# Patient Record
Sex: Male | Born: 1965
Health system: Southern US, Community
[De-identification: ages and names within clinical notes are randomized; demographics above are authoritative.]

## PROBLEM LIST (undated history)

## (undated) DIAGNOSIS — I10 Essential (primary) hypertension: Secondary | ICD-10-CM

## (undated) HISTORY — DX: Essential (primary) hypertension: I10

## (undated) HISTORY — PX: OTHER SURGICAL HISTORY: SHX169

---

## 2009-11-09 ENCOUNTER — Ambulatory Visit: Payer: Self-pay | Admitting: Surgery

## 2009-11-09 ENCOUNTER — Encounter (INDEPENDENT_AMBULATORY_CARE_PROVIDER_SITE_OTHER): Payer: Self-pay | Admitting: Orthopedic Surgery

## 2009-11-09 ENCOUNTER — Ambulatory Visit: Admission: RE | Admit: 2009-11-09 | Discharge: 2009-11-09 | Payer: Self-pay | Admitting: Orthopedic Surgery

## 2011-09-30 ENCOUNTER — Other Ambulatory Visit: Payer: Self-pay | Admitting: Physician Assistant

## 2012-01-30 ENCOUNTER — Other Ambulatory Visit: Payer: Self-pay | Admitting: Physician Assistant

## 2012-04-08 ENCOUNTER — Ambulatory Visit: Payer: 59

## 2012-04-08 ENCOUNTER — Ambulatory Visit (INDEPENDENT_AMBULATORY_CARE_PROVIDER_SITE_OTHER): Payer: 59 | Admitting: Family Medicine

## 2012-04-08 VITALS — BP 164/90 | HR 72 | Temp 99.0°F | Resp 16 | Ht 69.25 in | Wt 164.2 lb

## 2012-04-08 DIAGNOSIS — I10 Essential (primary) hypertension: Secondary | ICD-10-CM

## 2012-04-08 DIAGNOSIS — R200 Anesthesia of skin: Secondary | ICD-10-CM

## 2012-04-08 DIAGNOSIS — R079 Chest pain, unspecified: Secondary | ICD-10-CM

## 2012-04-08 DIAGNOSIS — R209 Unspecified disturbances of skin sensation: Secondary | ICD-10-CM

## 2012-04-08 LAB — COMPREHENSIVE METABOLIC PANEL WITH GFR
AST: 11 U/L (ref 0–37)
Albumin: 4.4 g/dL (ref 3.5–5.2)
Alkaline Phosphatase: 63 U/L (ref 39–117)
BUN: 7 mg/dL (ref 6–23)
Creat: 1.02 mg/dL (ref 0.50–1.35)
Potassium: 4.5 meq/L (ref 3.5–5.3)

## 2012-04-08 LAB — POCT CBC
Granulocyte percent: 67 % (ref 37–80)
HCT, POC: 45.2 % (ref 43.5–53.7)
Hemoglobin: 14.3 g/dL (ref 14.1–18.1)
Lymph, poc: 2 (ref 0.6–3.4)
MCH, POC: 30.2 pg (ref 27–31.2)
MCHC: 31.6 g/dL — AB (ref 31.8–35.4)
MCV: 95.5 fL (ref 80–97)
MID (cbc): 0.6 (ref 0–0.9)
MPV: 9.3 fL (ref 0–99.8)
POC Granulocyte: 5.3 (ref 2–6.9)
POC LYMPH PERCENT: 25.7 % (ref 10–50)
POC MID %: 7.3 % (ref 0–12)
Platelet Count, POC: 368 10*3/uL (ref 142–424)
RBC: 4.73 M/uL (ref 4.69–6.13)
RDW, POC: 14.2 %
WBC: 7.9 10*3/uL (ref 4.6–10.2)

## 2012-04-08 LAB — D-DIMER, QUANTITATIVE: D-Dimer, Quant: 0.27 ug/mL-FEU (ref 0.00–0.48)

## 2012-04-08 LAB — TROPONIN I: Troponin I: <0.01 ng/mL (ref <0.06)

## 2012-04-08 LAB — COMPREHENSIVE METABOLIC PANEL
ALT: 9 U/L (ref 0–53)
CO2: 24 mEq/L (ref 19–32)
Calcium: 10.1 mg/dL (ref 8.4–10.5)
Chloride: 104 mEq/L (ref 96–112)
Glucose, Bld: 91 mg/dL (ref 70–99)
Sodium: 142 mEq/L (ref 135–145)
Total Bilirubin: 0.4 mg/dL (ref 0.3–1.2)
Total Protein: 7.6 g/dL (ref 6.0–8.3)

## 2012-04-08 LAB — LDL CHOLESTEROL, DIRECT: Direct LDL: 80 mg/dL

## 2012-04-08 MED ORDER — LISINOPRIL 20 MG PO TABS: 20.0000 mg | ORAL_TABLET | Freq: Every day | ORAL | Status: DC

## 2012-04-08 NOTE — Progress Notes (Signed)
[facility]:  Office Visit  Chief Complaint:  Chief Complaint  Patient presents with  . Arm Problem    x today    eft shoulder radiating to fingers  numb,ingling  . Back Pain    x today   mid  left back pain  . Breathing Problem    intermittent  mild pressure with breathing    HPI: Michael Dickson is a 46 y.o. male who complains of left arm numbness starting from shoulder radiating down to left hand, fingers, associated with back pain radiating to chest with deep breathing. CP described as sharp pain with deep breathing  1/10 on pain scale. No prior history of CP. Denies high cholesterol, denies diabetes. Denies neck or back injury. Prior h/o right sided broken ribs. Has been taking in a lot of caffeine. Service technician for heating and air. Patient's been under a lot of stress with daughter recently.  Has had  A lot of stress in last month 160s/100s but normally runs 140s/80s H/o smoker 1 ppd x 10 years, quit 14 years ago Father with h/o MI at age 20  Past Medical History  Diagnosis Date  . Hypertension    Past Surgical History  Procedure Date  . Right knee arthroscopy    History   Social History  . Marital Status: Married    Spouse Name: N/A    Number of Children: N/A  . Years of Education: N/A   Social History Main Topics  . Smoking status: Former Games developer  . Smokeless tobacco: None  . Alcohol Use: None  . Drug Use: None  . Sexually Active: None   Other Topics Concern  . None   Social History Narrative  . None   Family History  Problem Relation Age of Onset  . Cancer Mother   . Heart disease Father    Allergies  Allergen Reactions  . Penicillins Other (See Comments)    Childhood    Prior to Admission medications   Medication Sig Start Date End Date Taking? Authorizing Provider  lisinopril (PRINIVIL,ZESTRIL) 20 MG tablet TAKE 1 TABLET BY MOUTH EVERY DAY 01/30/12  Yes Pattricia Boss, PA-C     ROS: The patient denies fevers,  chills, night sweats, unintentional weight loss,palpitations, wheezing, dyspnea on exertion, nausea, vomiting, abdominal pain, dysuria, hematuria, melena,  weakness,  + HA off and on.   All other systems have been reviewed and were otherwise negative with the exception of those mentioned in the HPI and as above.    PHYSICAL EXAM: Filed Vitals:   04/08/12 1334  BP: 164/90  Pulse: 72  Temp: 99 F (37.2 C)  Resp: 16   Filed Vitals:   04/08/12 1334  Height: 5' 9.25" (1.759 m)  Weight: 164 lb 3.2 oz (74.481 kg)   Body mass index is 24.07 kg/(m^2).  General: Alert, no acute distress HEENT:  Normocephalic, atraumatic, oropharynx patent. EOMI, PERRLA, fundoscopic exam nl Cardiovascular:  Regular rate and rhythm, no rubs murmurs or gallops.  No Carotid bruits, radial pulse intact. No pedal edema. No JVD Respiratory: Clear to auscultation bilaterally.  No wheezes, rales, or rhonchi.  No cyanosis, no use of accessory musculature GI: No organomegaly, abdomen is soft and non-tender, positive bowel sounds.  No masses. Skin: No rashes. Neurologic: Facial musculature symmetric. Psychiatric: Patient is appropriate throughout our interaction. Lymphatic: No cervical lymphadenopathy Musculoskeletal: Gait intact.   LABS: Results for orders placed in visit on 04/08/12  POCT CBC  Component Value Range   WBC 7.9  4.6 - 10.2 K/uL   Lymph, poc 2.0  0.6 - 3.4   POC LYMPH PERCENT 25.7  10 - 50 %L   MID (cbc) 0.6  0 - 0.9   POC MID % 7.3  0 - 12 %M   POC Granulocyte 5.3  2 - 6.9   Granulocyte percent 67.0  37 - 80 %G   RBC 4.73  4.69 - 6.13 M/uL   Hemoglobin 14.3  14.1 - 18.1 g/dL   HCT, POC 91.4  78.2 - 53.7 %   MCV 95.5  80 - 97 fL   MCH, POC 30.2  27 - 31.2 pg   MCHC 31.6 (*) 31.8 - 35.4 g/dL   RDW, POC 95.6     Platelet Count, POC 368  142 - 424 K/uL   MPV 9.3  0 - 99.8 fL     EKG/XRAY:   Primary read interpreted by Dr. Conley Rolls at [facility]. CXR no acute cardiopulmonary process EKG 90 bpm,  NSR, no ST elevation/depression   ASSESSMENT/PLAN: Encounter Diagnoses  Name Primary?  . Left arm numbness Yes  . Chest pain    Primary complaint is left arm numbness Chest pain is atypical, associated with pleurtitic CP with deep breaths Risk factor is HTN, no family history of premature MIs, EKG normal, CXR normal Will get D/Dimer, Troponin, CMP Directed to go to ER for worsening sxs  F/u if no improvement in numbness, tingling. If all labs nl then recommend NSAID. Will call patient with further management recommendations after get test results.   Monitor HTN-Refilled HTN meds. Lisinopril 20 mg daily if continue to be higher than 140/90 then start BID.     Elyana Grabski PHUONG, DO 04/08/2012 3:02 PM

## 2012-04-10 ENCOUNTER — Telehealth: Payer: Self-pay | Admitting: Family Medicine

## 2012-04-10 NOTE — Telephone Encounter (Signed)
Lm that all labs normal.

## 2012-11-08 ENCOUNTER — Other Ambulatory Visit: Payer: Self-pay | Admitting: Family Medicine

## 2013-06-14 IMAGING — CR DG CHEST 2V
2 series · 2 of 2 positions shown · non-contrast
Comparison: None.

CLINICAL DATA: Left arm numbness.  Chest pain.

CHEST - 2 VIEW

[PA]
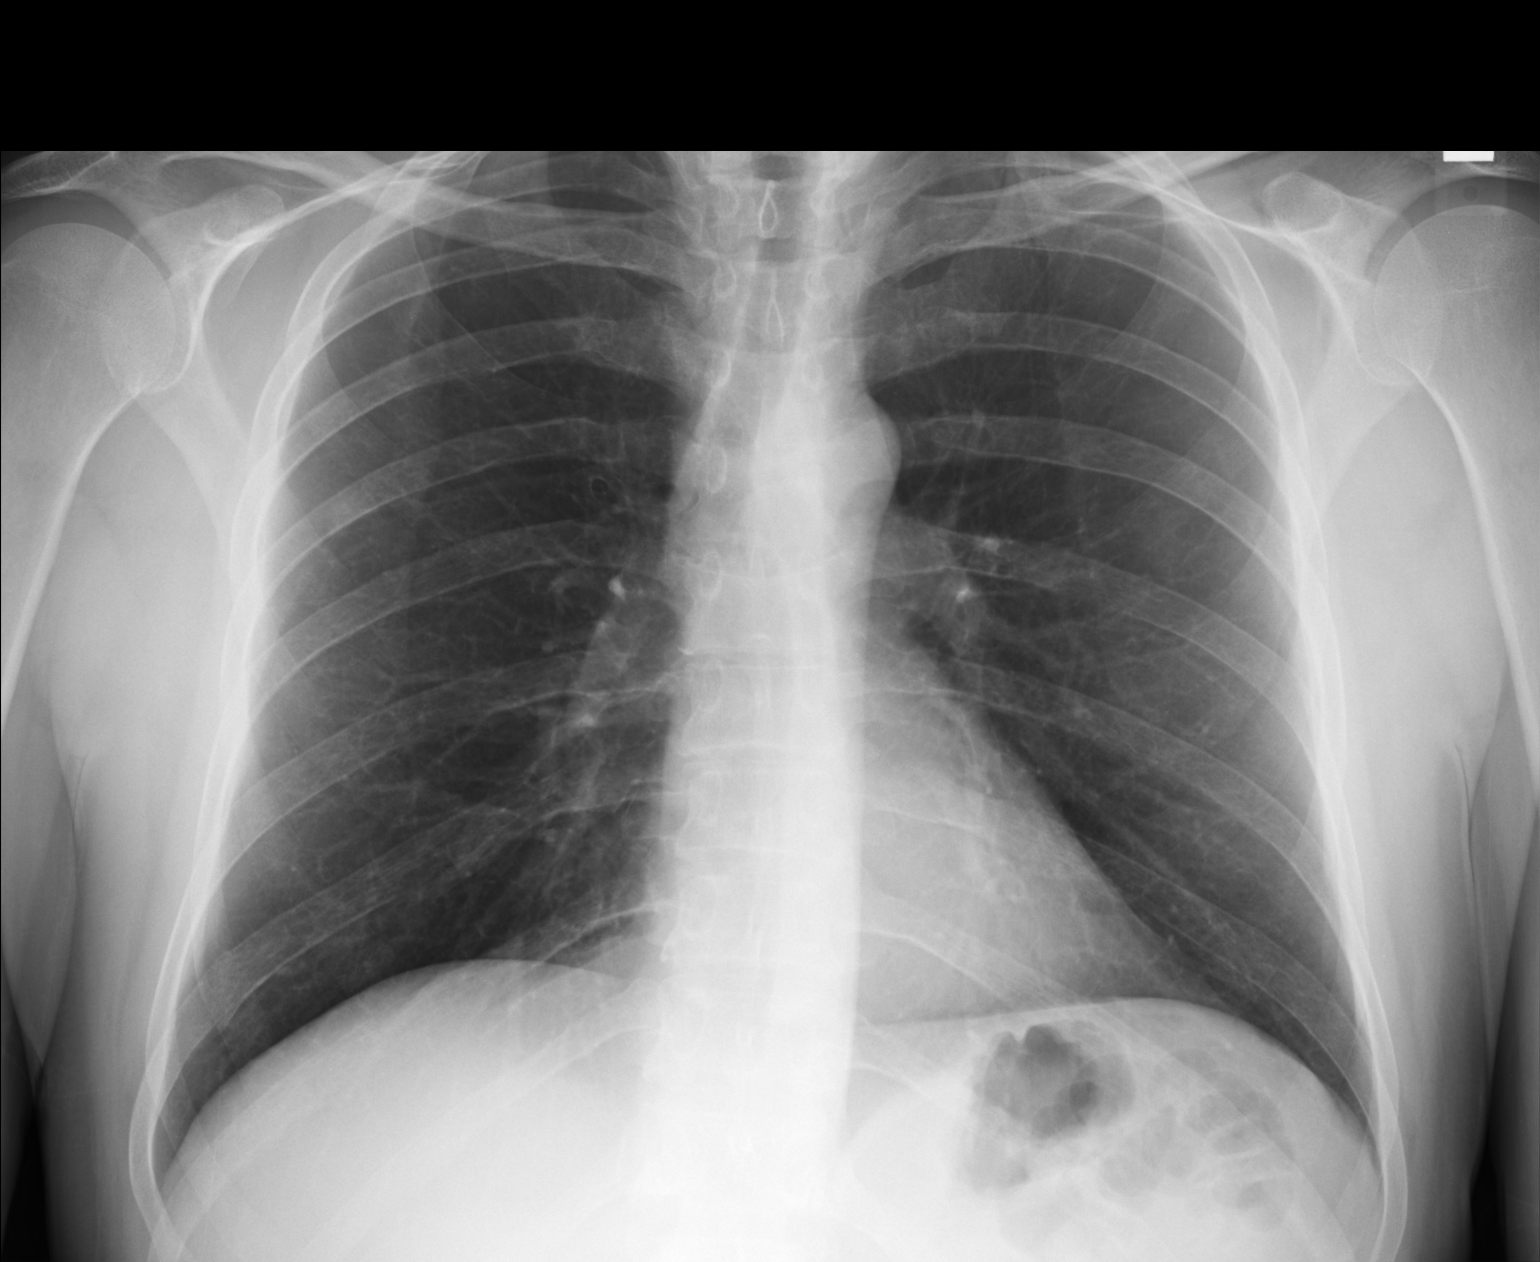

[lateral]
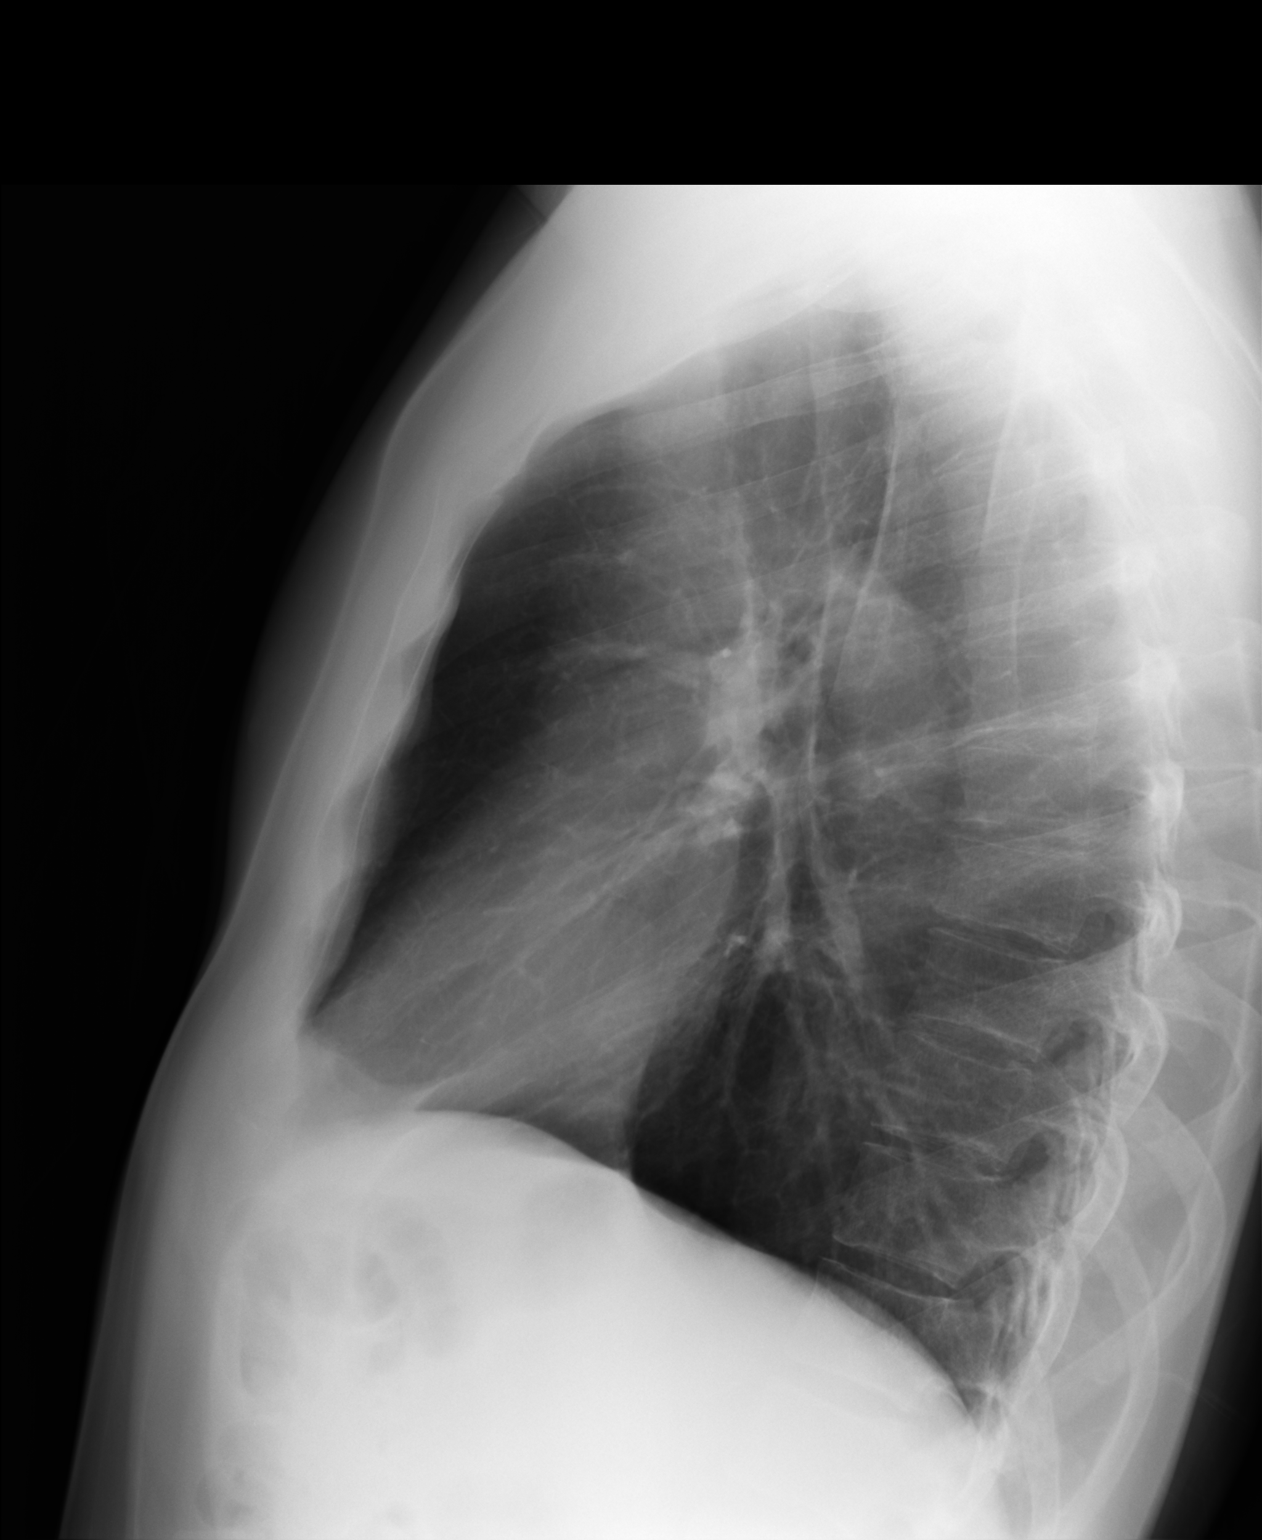

[2 of 2 positions shown; findings below may reference images not displayed]

FINDINGS: Normal heart size.  Lungs hyperaerated and clear.  No
pneumothorax.  Right posterolateral rib deformity has a chronic
appearance.
IMPRESSION: No active cardiopulmonary disease.  Changes related to COPD are
noted.

Clinically significant discrepancy from primary report, if
provided: None

## 2016-02-28 ENCOUNTER — Ambulatory Visit (INDEPENDENT_AMBULATORY_CARE_PROVIDER_SITE_OTHER): Payer: 59

## 2016-02-28 ENCOUNTER — Ambulatory Visit (INDEPENDENT_AMBULATORY_CARE_PROVIDER_SITE_OTHER): Payer: 59 | Admitting: Family Medicine

## 2016-02-28 VITALS — BP 150/98 | HR 79 | Temp 98.9°F | Resp 18 | Ht 69.25 in | Wt 174.0 lb

## 2016-02-28 DIAGNOSIS — IMO0001 Reserved for inherently not codable concepts without codable children: Secondary | ICD-10-CM

## 2016-02-28 DIAGNOSIS — Z8249 Family history of ischemic heart disease and other diseases of the circulatory system: Secondary | ICD-10-CM

## 2016-02-28 DIAGNOSIS — R0789 Other chest pain: Secondary | ICD-10-CM

## 2016-02-28 DIAGNOSIS — J988 Other specified respiratory disorders: Secondary | ICD-10-CM | POA: Diagnosis not present

## 2016-02-28 DIAGNOSIS — J22 Unspecified acute lower respiratory infection: Secondary | ICD-10-CM

## 2016-02-28 DIAGNOSIS — R030 Elevated blood-pressure reading, without diagnosis of hypertension: Secondary | ICD-10-CM

## 2016-02-28 DIAGNOSIS — R059 Cough, unspecified: Secondary | ICD-10-CM

## 2016-02-28 DIAGNOSIS — R05 Cough: Secondary | ICD-10-CM

## 2016-02-28 LAB — COMPREHENSIVE METABOLIC PANEL
ALK PHOS: 80 U/L (ref 40–115)
ALT: 11 U/L (ref 9–46)
AST: 15 U/L (ref 10–40)
Albumin: 4.1 g/dL (ref 3.6–5.1)
BUN: 10 mg/dL (ref 7–25)
CALCIUM: 9.5 mg/dL (ref 8.6–10.3)
CHLORIDE: 103 mmol/L (ref 98–110)
CO2: 22 mmol/L (ref 20–31)
Creat: 0.96 mg/dL (ref 0.60–1.35)
GLUCOSE: 82 mg/dL (ref 65–99)
POTASSIUM: 3.9 mmol/L (ref 3.5–5.3)
Sodium: 139 mmol/L (ref 135–146)
Total Bilirubin: 0.6 mg/dL (ref 0.2–1.2)
Total Protein: 7.3 g/dL (ref 6.1–8.1)

## 2016-02-28 LAB — POCT CBC
Granulocyte percent: 73 %G (ref 37–80)
HCT, POC: 40.1 % — AB (ref 43.5–53.7)
Hemoglobin: 14.4 g/dL (ref 14.1–18.1)
Lymph, poc: 2 (ref 0.6–3.4)
MCH: 32 pg — AB (ref 27–31.2)
MCHC: 35.8 g/dL — AB (ref 31.8–35.4)
MCV: 89.2 fL (ref 80–97)
MID (CBC): 0.9 (ref 0–0.9)
MPV: 7.1 fL (ref 0–99.8)
POC Granulocyte: 7.9 — AB (ref 2–6.9)
POC LYMPH PERCENT: 18.8 %L (ref 10–50)
POC MID %: 8.2 % (ref 0–12)
Platelet Count, POC: 318 10*3/uL (ref 142–424)
RBC: 4.49 M/uL — AB (ref 4.69–6.13)
RDW, POC: 12.9 %
WBC: 10.8 10*3/uL — AB (ref 4.6–10.2)

## 2016-02-28 MED ORDER — PREDNISONE 20 MG PO TABS: ORAL_TABLET | ORAL | Status: AC

## 2016-02-28 MED ORDER — AZITHROMYCIN 250 MG PO TABS: ORAL_TABLET | ORAL | Status: AC

## 2016-02-28 MED ORDER — HYDROCOD POLST-CPM POLST ER 10-8 MG/5ML PO SUER: 5.0000 mL | Freq: Two times a day (BID) | ORAL | Status: AC | PRN

## 2016-02-28 NOTE — Patient Instructions (Addendum)
   IF you received an x-ray today, you will receive an invoice from [facility]. Please contact [facility] at [phone] with questions or concerns regarding your invoice.   IF you received labwork today, you will receive an invoice from [facility]. Please contact [facility] at [phone] with questions or concerns regarding your invoice.   Our billing staff will not be able to assist you with questions regarding bills from these companies.  You will be contacted with the lab results as soon as they are available. The fastest way to get your results is to activate your My Chart account. Instructions are located on the last page of this paperwork. If you have not heard from us regarding the results in 2 weeks, please contact this office.    Acute Bronchitis Bronchitis is inflammation of the airways that extend from the windpipe into the lungs (bronchi). The inflammation often causes mucus to develop. This leads to a cough, which is the most common symptom of bronchitis.  In acute bronchitis, the condition usually develops suddenly and goes away over time, usually in a couple weeks. Smoking, allergies, and asthma can make bronchitis worse. Repeated episodes of bronchitis may cause further lung problems.  CAUSES Acute bronchitis is most often caused by the same virus that causes a cold. The virus can spread from person to person (contagious) through coughing, sneezing, and touching contaminated objects. SIGNS AND SYMPTOMS   Cough.   Fever.   Coughing up mucus.   Body aches.   Chest congestion.   Chills.   Shortness of breath.   Sore throat.  DIAGNOSIS  Acute bronchitis is usually diagnosed through a physical exam. Your health care provider will also ask you questions about your medical history. Tests, such as chest X-rays, are sometimes done to rule out other conditions.  TREATMENT  Acute bronchitis usually goes away in a  couple weeks. Oftentimes, no medical treatment is necessary. Medicines are sometimes given for relief of fever or cough. Antibiotic medicines are usually not needed but may be prescribed in certain situations. In some cases, an inhaler may be recommended to help reduce shortness of breath and control the cough. A cool mist vaporizer may also be used to help thin bronchial secretions and make it easier to clear the chest.  HOME CARE INSTRUCTIONS  Get plenty of rest.   Drink enough fluids to keep your urine clear or pale yellow (unless you have a medical condition that requires fluid restriction). Increasing fluids may help thin your respiratory secretions (sputum) and reduce chest congestion, and it will prevent dehydration.   Take medicines only as directed by your health care provider.  If you were prescribed an antibiotic medicine, finish it all even if you start to feel better.  Avoid smoking and secondhand smoke. Exposure to cigarette smoke or irritating chemicals will make bronchitis worse. If you are a smoker, consider using nicotine gum or skin patches to help control withdrawal symptoms. Quitting smoking will help your lungs heal faster.   Reduce the chances of another bout of acute bronchitis by washing your hands frequently, avoiding people with cold symptoms, and trying not to touch your hands to your mouth, nose, or eyes.   Keep all follow-up visits as directed by your health care provider.  SEEK MEDICAL CARE IF: Your symptoms do not improve after 1 week of treatment.  SEEK IMMEDIATE MEDICAL CARE IF:  You develop an increased fever or chills.   You have chest pain.     You have severe shortness of breath.  You have bloody sputum.   You develop dehydration.  You faint or repeatedly feel like you are going to pass out.  You develop repeated vomiting.  You develop a severe headache. MAKE SURE YOU:   Understand these instructions.  Will watch your  condition.  Will get help right away if you are not doing well or get worse.   This information is not intended to replace advice given to you by your health care provider. Make sure you discuss any questions you have with your health care provider.   Document Released: 09/21/2004 Document Revised: 09/04/2014 Document Reviewed: 02/04/2013 Elsevier Interactive Patient Education 2016 Elsevier Inc.  

## 2016-02-28 NOTE — Progress Notes (Signed)
Subjective:    Patient ID: Charlann NossSteven D Bansal, male    DOB: 1966/04/06, 50 y.o.   MRN: [id number]  02/28/2016  Cough (x 36month); Shortness of Breath; and Fatigue (x2weeks )   HPI This 50 y.o. male presents for evaluation of cough, SOB, fatigue.  Onset a cough for one month; cannot get rid of it.  At work, get really fatigued for the past 2-3 weeks.  No fever/chills/sweats.  Cough started with a cold/sickness in May 2017 while on camping trip in [place].  No headache.  No ear pain or sore throat.  No rhinorrhea or nasal congestion.  +coughing daily constant and nighttime; no nighttime awakening.  No sputum.  +SOB with exertion; no orthopnea.  No chest pain but tightness.  +wheezing.  No n/v/d.  Former smoke; quit smoking 20 years ago.  No asthma childhood.  No sick contacts.  Service technician heating and air.   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  No medications for symptoms.  Family history of CAD in brother and father; worried about heart related issues.  With chest tightness, working and exerting self; associated coughing with tightness; denies diaphoresis or nausea.  PCP: [facility]  HTN: previous medication; stopped medication; checks BP at home; not sure of readings.      Review of Systems  Constitutional: Positive for fatigue. Negative for fever, chills, diaphoresis, activity change and appetite change.  HENT: Negative for congestion, ear pain, postnasal drip, rhinorrhea and sore throat.   Respiratory: Positive for cough, chest tightness, shortness of breath and wheezing.   Cardiovascular: Negative for chest pain, palpitations and leg swelling.  Gastrointestinal: Negative for nausea, vomiting, abdominal pain and diarrhea.  Endocrine: Negative for cold intolerance, heat intolerance, polydipsia, polyphagia and polyuria.  Skin: Negative for color change, rash and wound.  Neurological: Negative for dizziness, tremors, seizures, syncope, facial asymmetry, speech difficulty, weakness,  light-headedness, numbness and headaches.  Psychiatric/Behavioral: Negative for sleep disturbance and dysphoric mood. The patient is not nervous/anxious.     Past Medical History:  Diagnosis Date  . Hypertension    Past Surgical History:  Procedure Laterality Date  . right knee arthroscopy     Allergies  Allergen Reactions  . Penicillins Other (See Comments)    Childhood     Social History   Social History  . Marital status: Married    Spouse name: N/A  . Number of children: N/A  . Years of education: N/A   Occupational History  . Not on file.   Social History Main Topics  . Smoking status: Former Games developermoker  . Smokeless tobacco: Current User  . Alcohol use Yes  . Drug use: Unknown  . Sexual activity: Not on file   Other Topics Concern  . Not on file   Social History Narrative  . No narrative on file   Family History  Problem Relation Age of Onset  . Cancer Mother 8762    unknown primary  . Heart disease Father 1465    AMI/stenting  . Diabetes Father   . Hyperlipidemia Father   . Heart disease Brother [id number]    AMI/stenting       Objective:    BP (!) 150/98   Pulse 79   Temp 98.9 F (37.2 C) (Oral)   Resp 18   Ht 5' 9.25" (1.759 m)   Wt 174 lb (78.9 kg)   SpO2 100%   BMI 25.51 kg/m  Physical Exam  Constitutional: He is oriented to person, place, and time. He appears well-developed  and well-nourished. No distress.  HENT:  Head: Normocephalic and atraumatic.  Right Ear: External ear normal.  Left Ear: External ear normal.  Nose: Nose normal.  Mouth/Throat: Oropharynx is clear and moist.  Eyes: Conjunctivae and EOM are normal. Pupils are equal, round, and reactive to light.  Neck: Normal range of motion. Neck supple. Carotid bruit is not present. No thyromegaly present.  Cardiovascular: Normal rate, regular rhythm, normal heart sounds and intact distal pulses.  Exam reveals no gallop and no friction rub.   No murmur heard. Pulmonary/Chest: Effort normal  and breath sounds normal. He has no wheezes. He has no rales.  Abdominal: Soft. Bowel sounds are normal. He exhibits no distension and no mass. There is no tenderness. There is no rebound and no guarding.  Musculoskeletal: He exhibits no edema.  Lymphadenopathy:    He has no cervical adenopathy.  Neurological: He is alert and oriented to person, place, and time. No cranial nerve deficit.  Skin: Skin is warm and dry. No rash noted. He is not diaphoretic.  Psychiatric: He has a normal mood and affect. His behavior is normal.  Nursing note and vitals reviewed.  Results for orders placed or performed in visit on 02/28/16  Comprehensive metabolic panel  Result Value Ref Range   Sodium 139 135 - 146 mmol/L   Potassium 3.9 3.5 - 5.3 mmol/L   Chloride 103 98 - 110 mmol/L   CO2 22 20 - 31 mmol/L   Glucose, Bld 82 65 - 99 mg/dL   BUN 10 7 - 25 mg/dL   Creat 0.86 5.78 - 4.69 mg/dL   Total Bilirubin 0.6 0.2 - 1.2 mg/dL   Alkaline Phosphatase 80 40 - 115 U/L   AST 15 10 - 40 U/L   ALT 11 9 - 46 U/L   Total Protein 7.3 6.1 - 8.1 g/dL   Albumin 4.1 3.6 - 5.1 g/dL   Calcium 9.5 8.6 - 62.9 mg/dL  POCT CBC  Result Value Ref Range   WBC 10.8 (A) 4.6 - 10.2 K/uL   Lymph, poc 2.0 0.6 - 3.4   POC LYMPH PERCENT 18.8 10 - 50 %L   MID (cbc) 0.9 0 - 0.9   POC MID % 8.2 0 - 12 %M   POC Granulocyte 7.9 (A) 2 - 6.9   Granulocyte percent 73.0 37 - 80 %G   RBC 4.49 (A) 4.69 - 6.13 M/uL   Hemoglobin 14.4 14.1 - 18.1 g/dL   HCT, POC 52.8 (A) 41.3 - 53.7 %   MCV 89.2 80 - 97 fL   MCH, POC 32.0 (A) 27 - 31.2 pg   MCHC 35.8 (A) 31.8 - 35.4 g/dL   RDW, POC 24.4 %   Platelet Count, POC 318 142 - 424 K/uL   MPV 7.1 0 - 99.8 fL   Dg Chest 2 View  Result Date: 02/28/2016 CLINICAL DATA:  Cough.  Elevated blood pressure and chest tightness EXAM: CHEST  2 VIEW COMPARISON:  04/08/2012 FINDINGS: The heart size and mediastinal contours are within normal limits. Both lungs are clear. The visualized skeletal  structures are unremarkable. IMPRESSION: No active cardiopulmonary disease. Electronically Signed   By: Signa Kell M.D.   On: 02/28/2016 16:53   EKG: NSR; no ST changes    Assessment & Plan:   1. Lower respiratory infection   2. Cough   3. Blood pressure elevated   4. Chest tightness   5. Family history of cardiovascular disease    -New. -consistent  with lower respiratory infection.   -rx for Zpack, Prednisone, Tussionex provided. -if no improvement with treatment, refer for stress testing by cardiology. -to ED for acute worsening.   Orders Placed This Encounter  Procedures  . DG Chest 2 View    Standing Status:   Future    Number of Occurrences:   1    Standing Expiration Date:   02/27/2017    Order Specific Question:   Reason for Exam (SYMPTOM  OR DIAGNOSIS REQUIRED)    Answer:   cough for one month; fatigue; chest tightness with exertion    Order Specific Question:   Preferred imaging location?    Answer:   External  . Comprehensive metabolic panel  . POCT CBC  . EKG 12-Lead   Meds ordered this encounter  Medications  . azithromycin (ZITHROMAX) 250 MG tablet    Sig: Two tablets daily x 1 day then one tablet daily x 4 days    Dispense:  6 tablet    Refill:  0  . predniSONE (DELTASONE) 20 MG tablet    Sig: Two tablets daily x 5 days then one tablet daily x 5 days    Dispense:  15 tablet    Refill:  0  . chlorpheniramine-HYDROcodone (TUSSIONEX PENNKINETIC ER) 10-8 MG/5ML SUER    Sig: Take 5 mLs by mouth every 12 (twelve) hours as needed for cough.    Dispense:  180 mL    Refill:  0    No Follow-up on file.    Merdith Boyd Paulita FujitaMartin Shlome Baldree, M.D. [facility]  [place] [address] [place], [place]  [id number] [phone] phone [phone] fax

## 2016-03-14 ENCOUNTER — Encounter: Payer: Self-pay | Admitting: *Deleted

## 2017-05-05 IMAGING — DX DG CHEST 2V
2 series · 2 of 2 positions shown · non-contrast
Comparison: 04/08/2012

CLINICAL DATA: Cough.  Elevated blood pressure and chest tightness

EXAM:
CHEST  2 VIEW

[chest pa]
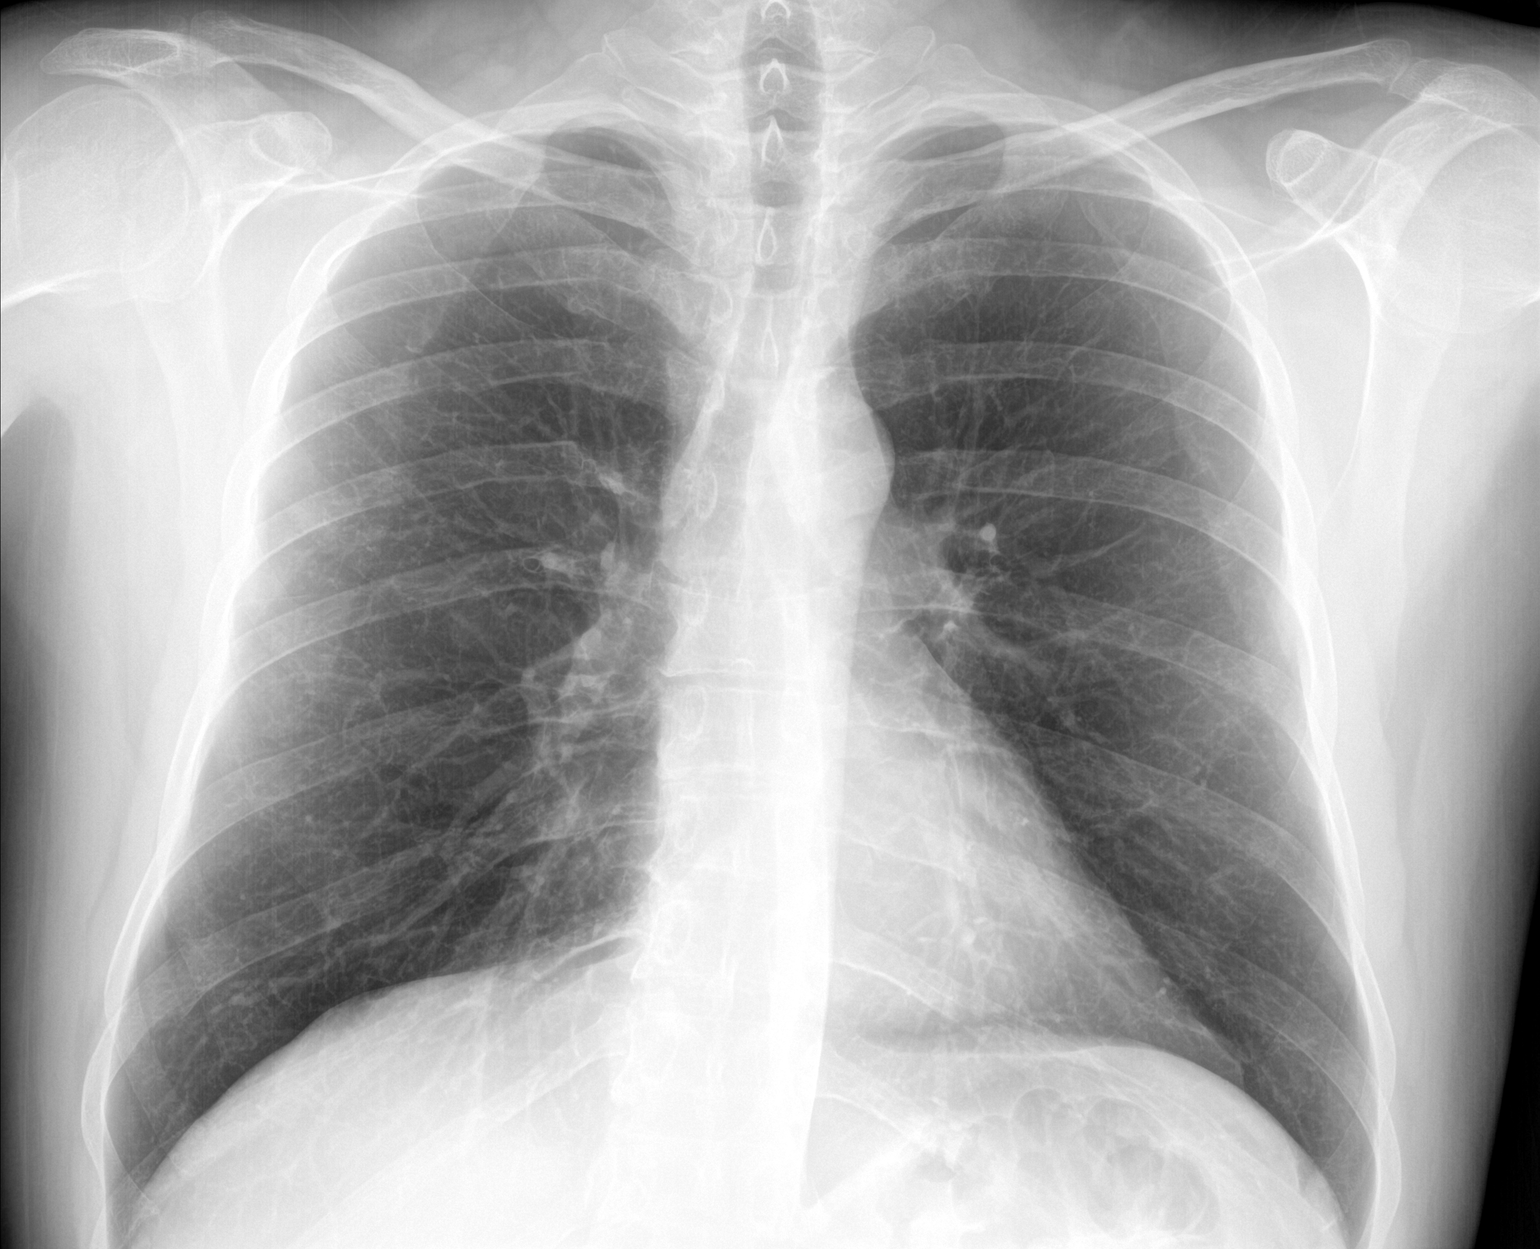

[chest lat]
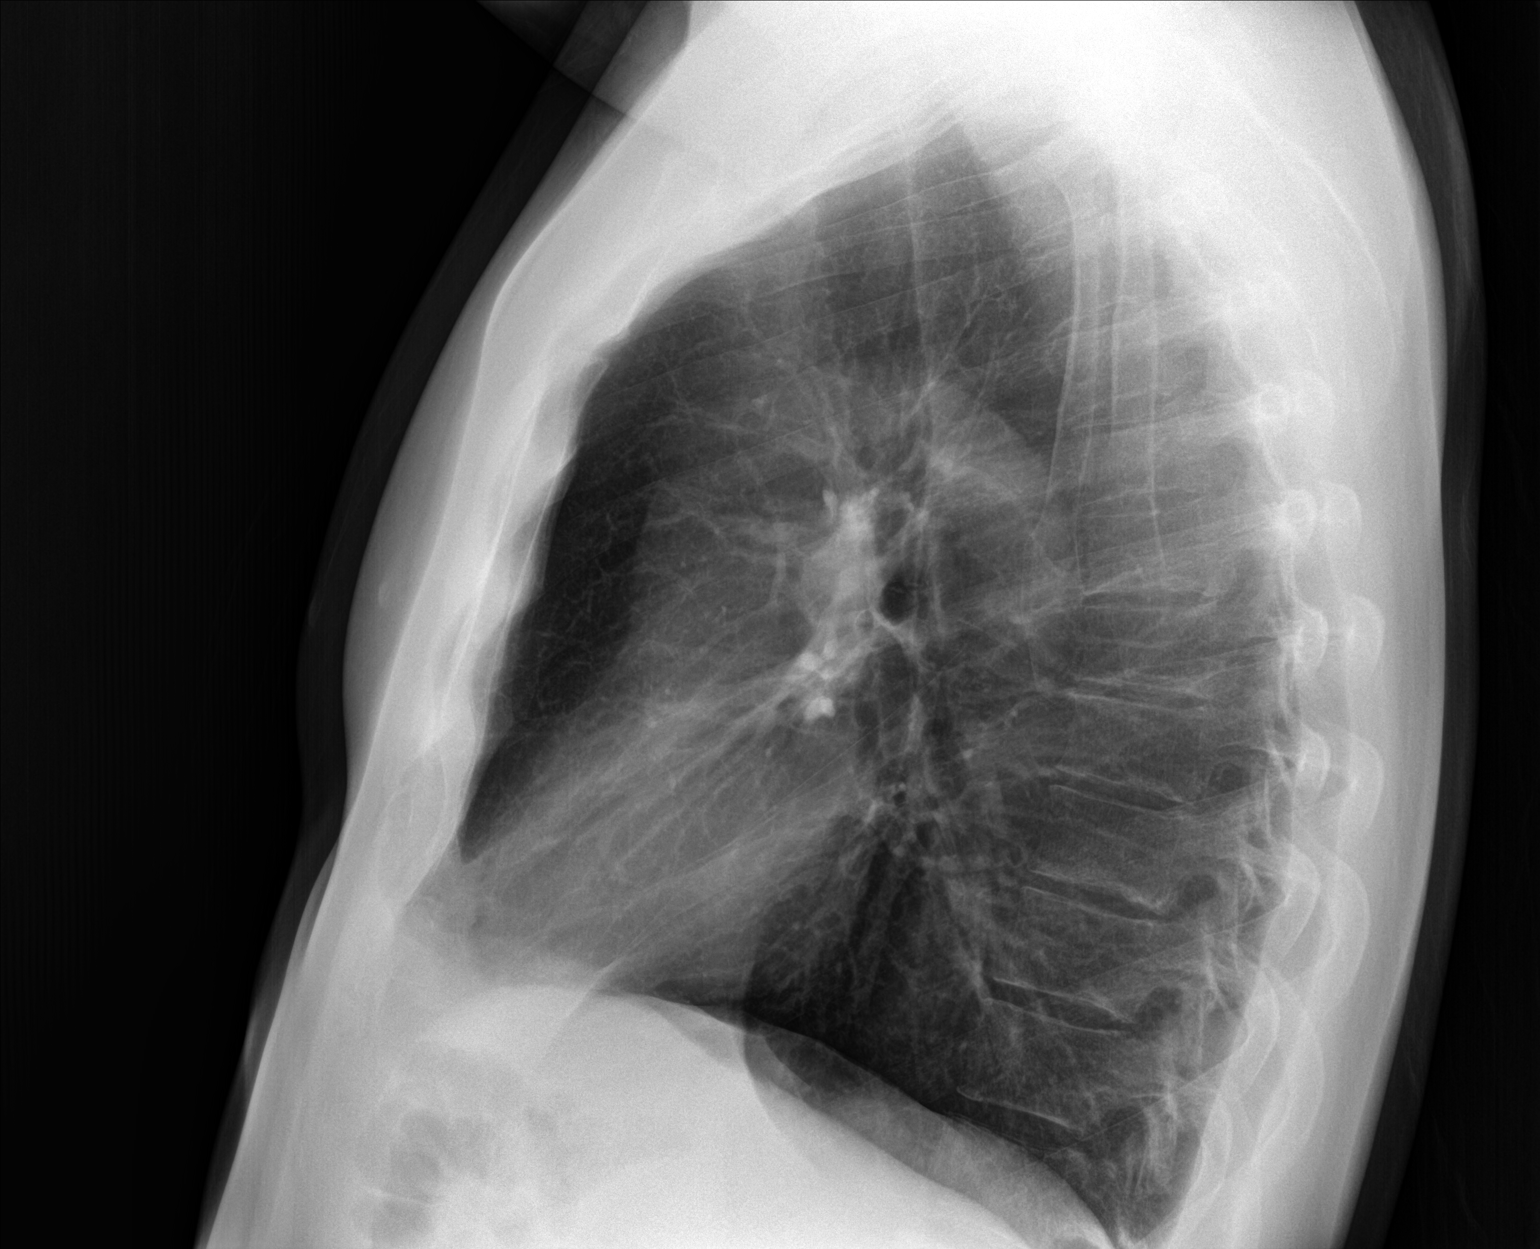

[2 of 2 positions shown; findings below may reference images not displayed]

FINDINGS: The heart size and mediastinal contours are within normal limits.
Both lungs are clear. The visualized skeletal structures are
unremarkable.
IMPRESSION: No active cardiopulmonary disease.

## 2018-01-17 ENCOUNTER — Encounter: Payer: Self-pay | Admitting: Family Medicine
